# Patient Record
Sex: Female | Born: 1940 | Race: White | Hispanic: No | Marital: Married
Health system: Southern US, Community
[De-identification: ages and names within clinical notes are randomized; demographics above are authoritative.]

---

## 2004-04-29 ENCOUNTER — Emergency Department: Payer: Self-pay | Admitting: Emergency Medicine

## 2005-05-15 ENCOUNTER — Emergency Department: Payer: Self-pay | Admitting: Unknown Physician Specialty

## 2006-01-14 ENCOUNTER — Emergency Department: Payer: Self-pay | Admitting: Emergency Medicine

## 2007-01-26 ENCOUNTER — Ambulatory Visit: Payer: Self-pay | Admitting: Surgery

## 2007-01-26 ENCOUNTER — Other Ambulatory Visit: Payer: Self-pay

## 2007-02-02 ENCOUNTER — Ambulatory Visit: Payer: Self-pay | Admitting: Surgery

## 2007-02-08 ENCOUNTER — Emergency Department: Payer: Self-pay | Admitting: Emergency Medicine

## 2007-05-08 ENCOUNTER — Emergency Department: Payer: Self-pay | Admitting: Emergency Medicine

## 2007-10-24 ENCOUNTER — Emergency Department: Payer: Self-pay | Admitting: Emergency Medicine

## 2007-10-31 ENCOUNTER — Emergency Department: Payer: Self-pay | Admitting: Emergency Medicine

## 2008-04-28 ENCOUNTER — Inpatient Hospital Stay: Payer: Self-pay | Admitting: Internal Medicine

## 2009-04-23 ENCOUNTER — Emergency Department: Payer: Self-pay | Admitting: Emergency Medicine

## 2009-08-10 ENCOUNTER — Emergency Department: Payer: Self-pay | Admitting: Emergency Medicine

## 2009-08-12 ENCOUNTER — Emergency Department: Payer: Self-pay | Admitting: Emergency Medicine

## 2011-03-10 ENCOUNTER — Ambulatory Visit: Payer: Self-pay | Admitting: Urology

## 2011-06-01 ENCOUNTER — Emergency Department: Payer: Self-pay | Admitting: Emergency Medicine

## 2011-06-01 LAB — COMPREHENSIVE METABOLIC PANEL
Albumin: 3.7 g/dL (ref 3.4–5.0)
Alkaline Phosphatase: 42 U/L — ABNORMAL LOW (ref 50–136)
Anion Gap: 5 — ABNORMAL LOW (ref 7–16)
BUN: 19 mg/dL — ABNORMAL HIGH (ref 7–18)
Calcium, Total: 9.2 mg/dL (ref 8.5–10.1)
Creatinine: 0.96 mg/dL (ref 0.60–1.30)
Glucose: 94 mg/dL (ref 65–99)
SGOT(AST): 35 U/L (ref 15–37)

## 2011-06-01 LAB — CK TOTAL AND CKMB (NOT AT ARMC): CK, Total: 99 U/L (ref 21–215)

## 2011-06-01 LAB — CBC WITH DIFFERENTIAL/PLATELET
Basophil %: 0.3 %
Eosinophil %: 1.2 %
HCT: 39.3 % (ref 35.0–47.0)
HGB: 13.3 g/dL (ref 12.0–16.0)
Lymphocyte #: 1.9 10*3/uL (ref 1.0–3.6)
MCH: 31 pg (ref 26.0–34.0)
MCHC: 33.8 g/dL (ref 32.0–36.0)
MCV: 92 fL (ref 80–100)
Monocyte #: 0.6 10*3/uL (ref 0.0–0.7)
Neutrophil #: 4.9 10*3/uL (ref 1.4–6.5)
Platelet: 185 10*3/uL (ref 150–440)
RBC: 4.29 10*6/uL (ref 3.80–5.20)

## 2011-06-01 LAB — URINALYSIS, COMPLETE
Bilirubin,UR: NEGATIVE
Blood: NEGATIVE
Glucose,UR: NEGATIVE mg/dL (ref 0–75)
Nitrite: POSITIVE
Protein: NEGATIVE
Specific Gravity: 1.009 (ref 1.003–1.030)
Squamous Epithelial: NONE SEEN
WBC UR: 32 /HPF (ref 0–5)

## 2011-06-01 LAB — TROPONIN I: Troponin-I: <0.02 ng/mL

## 2011-06-03 LAB — URINE CULTURE

## 2011-06-30 ENCOUNTER — Emergency Department: Payer: Self-pay | Admitting: Emergency Medicine

## 2011-09-02 ENCOUNTER — Emergency Department: Payer: Self-pay | Admitting: Emergency Medicine

## 2011-09-02 LAB — URINALYSIS, COMPLETE
Bilirubin,UR: NEGATIVE
Glucose,UR: NEGATIVE mg/dL (ref 0–75)
Ketone: NEGATIVE
Nitrite: POSITIVE
Ph: 7 (ref 4.5–8.0)
Protein: 30
Specific Gravity: 1.008 (ref 1.003–1.030)
WBC UR: 86 /HPF (ref 0–5)

## 2011-09-02 LAB — BASIC METABOLIC PANEL
Calcium, Total: 9.6 mg/dL (ref 8.5–10.1)
Chloride: 97 mmol/L — ABNORMAL LOW (ref 98–107)
Creatinine: 1.43 mg/dL — ABNORMAL HIGH (ref 0.60–1.30)
EGFR (Non-African Amer.): 36 — ABNORMAL LOW
Glucose: 110 mg/dL — ABNORMAL HIGH (ref 65–99)
Osmolality: 278 (ref 275–301)
Potassium: 2.8 mmol/L — ABNORMAL LOW (ref 3.5–5.1)
Sodium: 136 mmol/L (ref 136–145)

## 2011-09-02 LAB — CBC
HCT: 34.1 % — ABNORMAL LOW (ref 35.0–47.0)
RDW: 13.3 % (ref 11.5–14.5)
WBC: 12.8 10*3/uL — ABNORMAL HIGH (ref 3.6–11.0)

## 2011-09-06 LAB — URINE CULTURE

## 2011-09-08 ENCOUNTER — Emergency Department: Payer: Self-pay | Admitting: Internal Medicine

## 2011-09-08 LAB — CULTURE, BLOOD (SINGLE)

## 2011-10-02 ENCOUNTER — Observation Stay: Payer: Self-pay | Admitting: Internal Medicine

## 2011-10-02 LAB — URINALYSIS, COMPLETE
Bilirubin,UR: NEGATIVE
Nitrite: NEGATIVE
Ph: 7 (ref 4.5–8.0)
Protein: NEGATIVE
RBC,UR: 2 /HPF (ref 0–5)
Specific Gravity: 1.005 (ref 1.003–1.030)
Squamous Epithelial: <1
WBC UR: 1 /HPF (ref 0–5)

## 2011-10-02 LAB — COMPREHENSIVE METABOLIC PANEL
Albumin: 3.5 g/dL (ref 3.4–5.0)
Anion Gap: 5 — ABNORMAL LOW (ref 7–16)
BUN: 19 mg/dL — ABNORMAL HIGH (ref 7–18)
Bilirubin,Total: 0.6 mg/dL (ref 0.2–1.0)
Calcium, Total: 9.8 mg/dL (ref 8.5–10.1)
Chloride: 101 mmol/L (ref 98–107)
Creatinine: 0.99 mg/dL (ref 0.60–1.30)
Glucose: 87 mg/dL (ref 65–99)
Osmolality: 277 (ref 275–301)
Potassium: 3.4 mmol/L — ABNORMAL LOW (ref 3.5–5.1)
Sodium: 138 mmol/L (ref 136–145)
Total Protein: 7.1 g/dL (ref 6.4–8.2)

## 2011-10-02 LAB — CBC
HGB: 12.3 g/dL (ref 12.0–16.0)
MCHC: 33 g/dL (ref 32.0–36.0)
MCV: 92 fL (ref 80–100)
RBC: 4.07 10*6/uL (ref 3.80–5.20)
RDW: 13.4 % (ref 11.5–14.5)
WBC: 5.8 10*3/uL (ref 3.6–11.0)

## 2011-10-02 LAB — CK TOTAL AND CKMB (NOT AT ARMC): CK-MB: 5.1 ng/mL — ABNORMAL HIGH (ref 0.5–3.6)

## 2011-10-03 LAB — CBC WITH DIFFERENTIAL/PLATELET
Basophil #: 0 10*3/uL (ref 0.0–0.1)
Eosinophil #: 0.1 10*3/uL (ref 0.0–0.7)
HCT: 37.6 % (ref 35.0–47.0)
HGB: 12.7 g/dL (ref 12.0–16.0)
Lymphocyte %: 30.9 %
MCHC: 33.8 g/dL (ref 32.0–36.0)
Monocyte #: 0.5 x10 3/mm (ref 0.2–0.9)
Monocyte %: 10 %
Neutrophil %: 55.3 %
Platelet: 165 10*3/uL (ref 150–440)
RDW: 13.6 % (ref 11.5–14.5)

## 2011-10-03 LAB — BASIC METABOLIC PANEL
Calcium, Total: 9.3 mg/dL (ref 8.5–10.1)
Creatinine: 0.8 mg/dL (ref 0.60–1.30)
EGFR (African American): >60
EGFR (Non-African Amer.): >60
Glucose: 91 mg/dL (ref 65–99)
Potassium: 3.6 mmol/L (ref 3.5–5.1)
Sodium: 142 mmol/L (ref 136–145)

## 2011-10-03 LAB — MAGNESIUM: Magnesium: 2 mg/dL

## 2011-10-04 LAB — URINE CULTURE

## 2011-10-17 ENCOUNTER — Emergency Department: Payer: Self-pay | Admitting: *Deleted

## 2011-11-17 ENCOUNTER — Emergency Department: Payer: Self-pay | Admitting: Emergency Medicine

## 2012-01-02 ENCOUNTER — Emergency Department: Payer: Self-pay | Admitting: Emergency Medicine

## 2012-01-02 LAB — URINALYSIS, COMPLETE
Blood: NEGATIVE
Glucose,UR: NEGATIVE mg/dL (ref 0–75)
Ketone: NEGATIVE
Nitrite: NEGATIVE
Ph: 7 (ref 4.5–8.0)
Protein: NEGATIVE
RBC,UR: 1 /HPF (ref 0–5)
Specific Gravity: 1.01 (ref 1.003–1.030)
WBC UR: 4 /HPF (ref 0–5)

## 2012-01-02 LAB — COMPREHENSIVE METABOLIC PANEL
Albumin: 3.8 g/dL (ref 3.4–5.0)
Anion Gap: 9 (ref 7–16)
Bilirubin,Total: 0.7 mg/dL (ref 0.2–1.0)
Chloride: 102 mmol/L (ref 98–107)
Co2: 32 mmol/L (ref 21–32)
Creatinine: 1.15 mg/dL (ref 0.60–1.30)
EGFR (African American): 55 — ABNORMAL LOW
EGFR (Non-African Amer.): 47 — ABNORMAL LOW
Osmolality: 287 (ref 275–301)
Potassium: 4 mmol/L (ref 3.5–5.1)
SGOT(AST): 36 U/L (ref 15–37)
SGPT (ALT): 25 U/L (ref 12–78)
Total Protein: 7.2 g/dL (ref 6.4–8.2)

## 2012-01-02 LAB — ETHANOL
Ethanol %: <0.003 % (ref 0.000–0.080)
Ethanol: <3 mg/dL

## 2012-01-02 LAB — DRUG SCREEN, URINE
Benzodiazepine, Ur Scrn: NEGATIVE (ref ?–200)
Cannabinoid 50 Ng, Ur ~~LOC~~: NEGATIVE (ref ?–50)
MDMA (Ecstasy)Ur Screen: NEGATIVE (ref ?–500)
Phencyclidine (PCP) Ur S: NEGATIVE (ref ?–25)
Tricyclic, Ur Screen: POSITIVE (ref ?–1000)

## 2012-01-02 LAB — CBC
HCT: 39 % (ref 35.0–47.0)
HGB: 13.3 g/dL (ref 12.0–16.0)
MCH: 30.9 pg (ref 26.0–34.0)
MCV: 91 fL (ref 80–100)
RBC: 4.29 10*6/uL (ref 3.80–5.20)
RDW: 13 % (ref 11.5–14.5)
WBC: 5.9 10*3/uL (ref 3.6–11.0)

## 2012-01-02 LAB — ACETAMINOPHEN LEVEL: Acetaminophen: <2 ug/mL

## 2012-01-02 LAB — TSH: Thyroid Stimulating Horm: 15.4 u[IU]/mL — ABNORMAL HIGH

## 2012-01-02 LAB — SALICYLATE LEVEL: Salicylates, Serum: <1.7 mg/dL

## 2012-01-02 LAB — TROPONIN I: Troponin-I: <0.02 ng/mL

## 2012-01-10 ENCOUNTER — Emergency Department: Payer: Self-pay | Admitting: Emergency Medicine

## 2012-02-02 ENCOUNTER — Emergency Department: Payer: Self-pay | Admitting: Emergency Medicine

## 2012-02-02 LAB — COMPREHENSIVE METABOLIC PANEL
Albumin: 3.5 g/dL (ref 3.4–5.0)
Alkaline Phosphatase: 64 U/L (ref 50–136)
BUN: 27 mg/dL — ABNORMAL HIGH (ref 7–18)
Bilirubin,Total: 0.4 mg/dL (ref 0.2–1.0)
Chloride: 102 mmol/L (ref 98–107)
Creatinine: 1.5 mg/dL — ABNORMAL HIGH (ref 0.60–1.30)
EGFR (African American): 40 — ABNORMAL LOW
EGFR (Non-African Amer.): 34 — ABNORMAL LOW
Glucose: 89 mg/dL (ref 65–99)
Potassium: 3.4 mmol/L — ABNORMAL LOW (ref 3.5–5.1)
SGOT(AST): 30 U/L (ref 15–37)
Total Protein: 6.7 g/dL (ref 6.4–8.2)

## 2012-02-02 LAB — CBC WITH DIFFERENTIAL/PLATELET
Eosinophil #: 0.2 10*3/uL (ref 0.0–0.7)
HCT: 36.1 % (ref 35.0–47.0)
Lymphocyte #: 2.6 10*3/uL (ref 1.0–3.6)
Lymphocyte %: 33.1 %
MCHC: 32.9 g/dL (ref 32.0–36.0)
MCV: 90 fL (ref 80–100)
Monocyte %: 11.8 %
Neutrophil %: 51.7 %
Platelet: 217 10*3/uL (ref 150–440)
RBC: 4.02 10*6/uL (ref 3.80–5.20)
RDW: 13.4 % (ref 11.5–14.5)
WBC: 7.7 10*3/uL (ref 3.6–11.0)

## 2012-02-02 LAB — URINALYSIS, COMPLETE
Blood: NEGATIVE
Glucose,UR: NEGATIVE mg/dL (ref 0–75)
Nitrite: NEGATIVE
Ph: 6 (ref 4.5–8.0)
Protein: 30
RBC,UR: 2 /HPF (ref 0–5)

## 2012-02-10 ENCOUNTER — Emergency Department: Payer: Self-pay | Admitting: Emergency Medicine

## 2012-02-10 LAB — URINALYSIS, COMPLETE
Bilirubin,UR: NEGATIVE
Glucose,UR: NEGATIVE mg/dL (ref 0–75)
Nitrite: NEGATIVE
Specific Gravity: 1.016 (ref 1.003–1.030)
WBC UR: 4 /HPF (ref 0–5)

## 2012-02-12 ENCOUNTER — Ambulatory Visit: Payer: Self-pay | Admitting: Internal Medicine

## 2012-02-21 LAB — CBC
HCT: 37.3 % (ref 35.0–47.0)
HGB: 12.7 g/dL (ref 12.0–16.0)
MCV: 91 fL (ref 80–100)
Platelet: 201 10*3/uL (ref 150–440)
RBC: 4.12 10*6/uL (ref 3.80–5.20)
WBC: 8.9 10*3/uL (ref 3.6–11.0)

## 2012-02-21 LAB — URINALYSIS, COMPLETE
Blood: NEGATIVE
Glucose,UR: NEGATIVE mg/dL (ref 0–75)
Ketone: NEGATIVE
Nitrite: NEGATIVE
Ph: 7 (ref 4.5–8.0)
Protein: NEGATIVE
RBC,UR: 35 /HPF (ref 0–5)
Squamous Epithelial: NONE SEEN
WBC UR: 1859 /HPF (ref 0–5)

## 2012-02-21 LAB — COMPREHENSIVE METABOLIC PANEL
Alkaline Phosphatase: 73 U/L (ref 50–136)
Anion Gap: 7 (ref 7–16)
Bilirubin,Total: 0.9 mg/dL (ref 0.2–1.0)
Calcium, Total: 9.8 mg/dL (ref 8.5–10.1)
Chloride: 102 mmol/L (ref 98–107)
Co2: 30 mmol/L (ref 21–32)
EGFR (African American): 46 — ABNORMAL LOW
EGFR (Non-African Amer.): 40 — ABNORMAL LOW
Osmolality: 283 (ref 275–301)
Potassium: 4.3 mmol/L (ref 3.5–5.1)

## 2012-02-22 LAB — BASIC METABOLIC PANEL
Anion Gap: 7 (ref 7–16)
BUN: 27 mg/dL — ABNORMAL HIGH (ref 7–18)
Calcium, Total: 9 mg/dL (ref 8.5–10.1)
Co2: 27 mmol/L (ref 21–32)
Glucose: 83 mg/dL (ref 65–99)
Osmolality: 282 (ref 275–301)
Potassium: 3.6 mmol/L (ref 3.5–5.1)
Sodium: 139 mmol/L (ref 136–145)

## 2012-02-22 LAB — CBC WITH DIFFERENTIAL/PLATELET
Eosinophil #: 0.1 10*3/uL (ref 0.0–0.7)
Eosinophil %: 1.7 %
HCT: 35.8 % (ref 35.0–47.0)
HGB: 12.6 g/dL (ref 12.0–16.0)
Lymphocyte #: 1.4 10*3/uL (ref 1.0–3.6)
MCV: 89 fL (ref 80–100)
Monocyte %: 8.3 %
Neutrophil #: 5.3 10*3/uL (ref 1.4–6.5)
Neutrophil %: 70.7 %
RBC: 4.01 10*6/uL (ref 3.80–5.20)
RDW: 13 % (ref 11.5–14.5)

## 2012-02-23 ENCOUNTER — Inpatient Hospital Stay: Payer: Self-pay | Admitting: Family Medicine

## 2012-02-23 LAB — URINE CULTURE

## 2012-02-23 LAB — BASIC METABOLIC PANEL
Anion Gap: 12 (ref 7–16)
Calcium, Total: 8.8 mg/dL (ref 8.5–10.1)
Chloride: 103 mmol/L (ref 98–107)
Co2: 26 mmol/L (ref 21–32)
Creatinine: 1.13 mg/dL (ref 0.60–1.30)
Glucose: 91 mg/dL (ref 65–99)
Osmolality: 285 (ref 275–301)
Potassium: 2.9 mmol/L — ABNORMAL LOW (ref 3.5–5.1)

## 2012-02-23 LAB — MAGNESIUM: Magnesium: 0.9 mg/dL — ABNORMAL LOW

## 2012-03-14 ENCOUNTER — Ambulatory Visit: Payer: Self-pay | Admitting: Internal Medicine

## 2012-03-14 DEATH — deceased

## 2013-03-24 IMAGING — CR DG CHEST 2V
1 series · 3 of 3 positions shown · non-contrast
Comparison: none

REASON FOR EXAM: altered emntal status
COMMENTS:

PROCEDURE:     DXR - DXR CHEST PA (OR AP) AND LATERAL  - October 02, 2011  [DATE]
RESULT:     Comparison is made with the exam of 02 September, 2011.
There is hyperinflation consistent with COPD. There is no underlying edema,
infiltrate, effusion or mass. The bony structures appear intact.

[Series 3: x chest ap · 0.14mm/px · 3 of 3 slices shown]
[im 1/3]
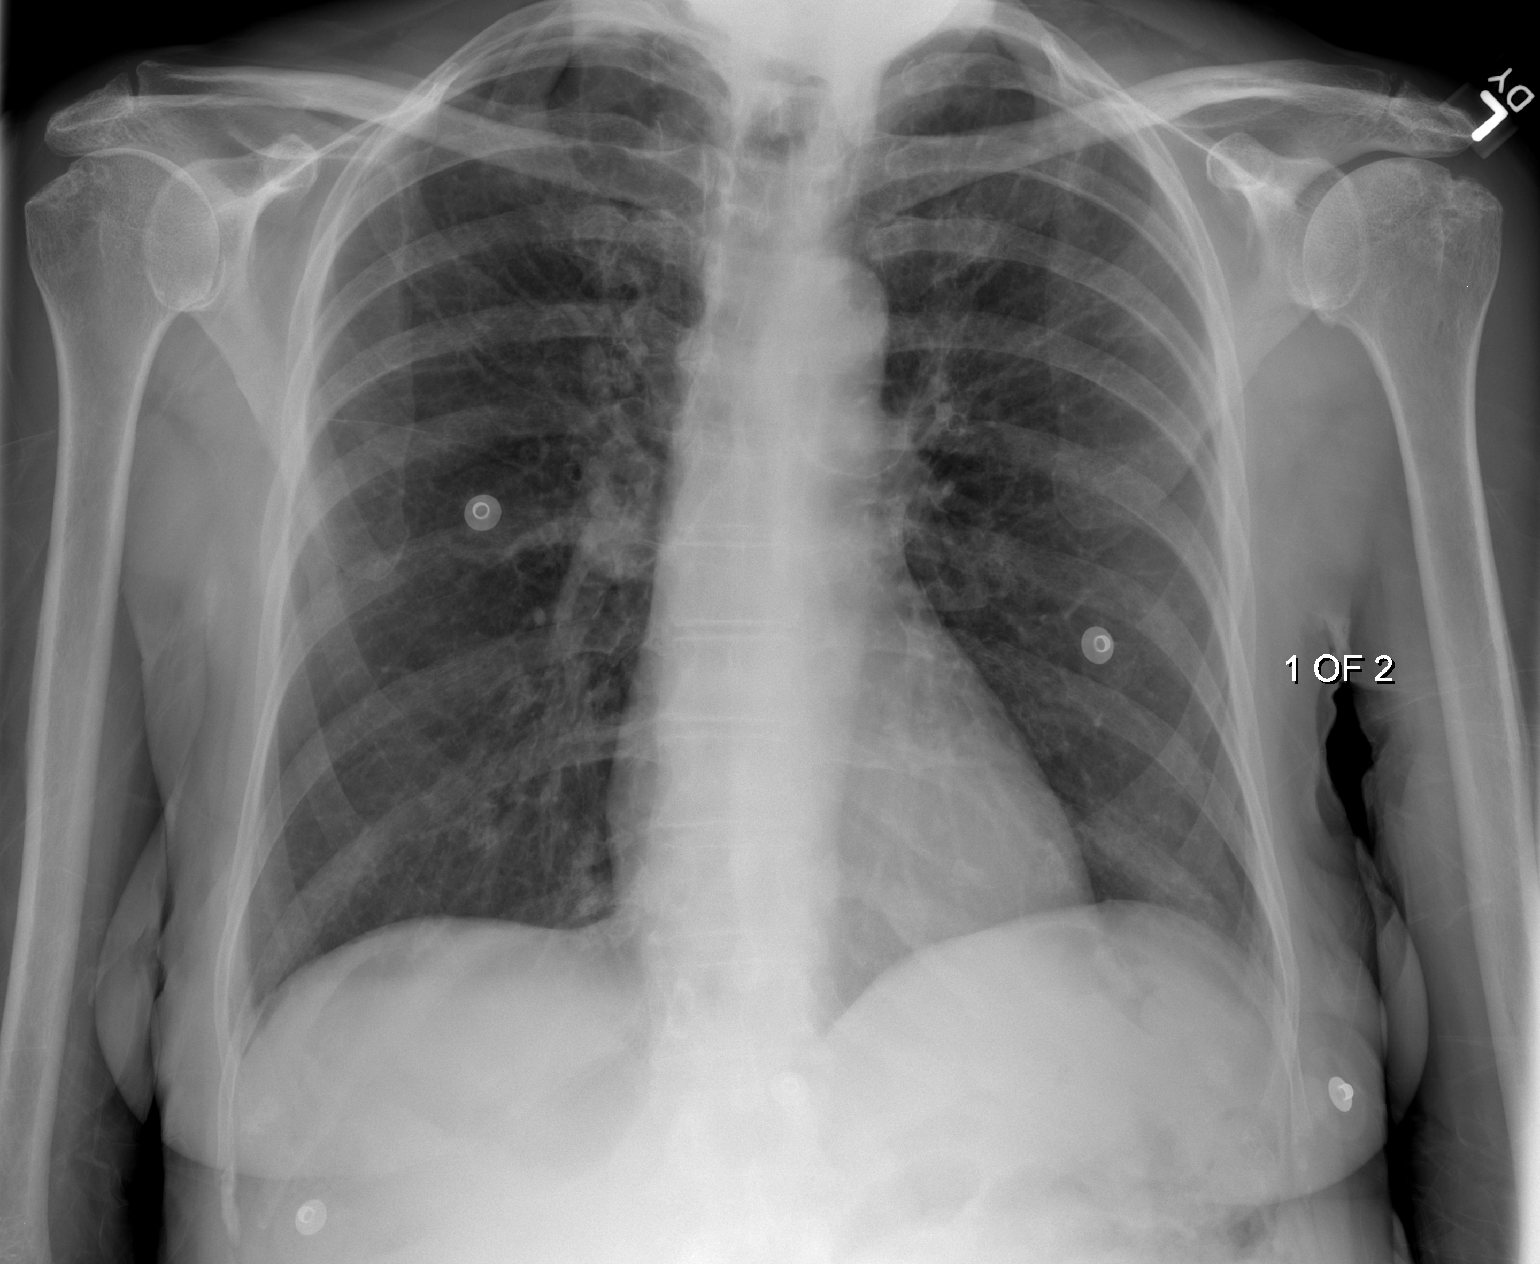
[im 2/3]
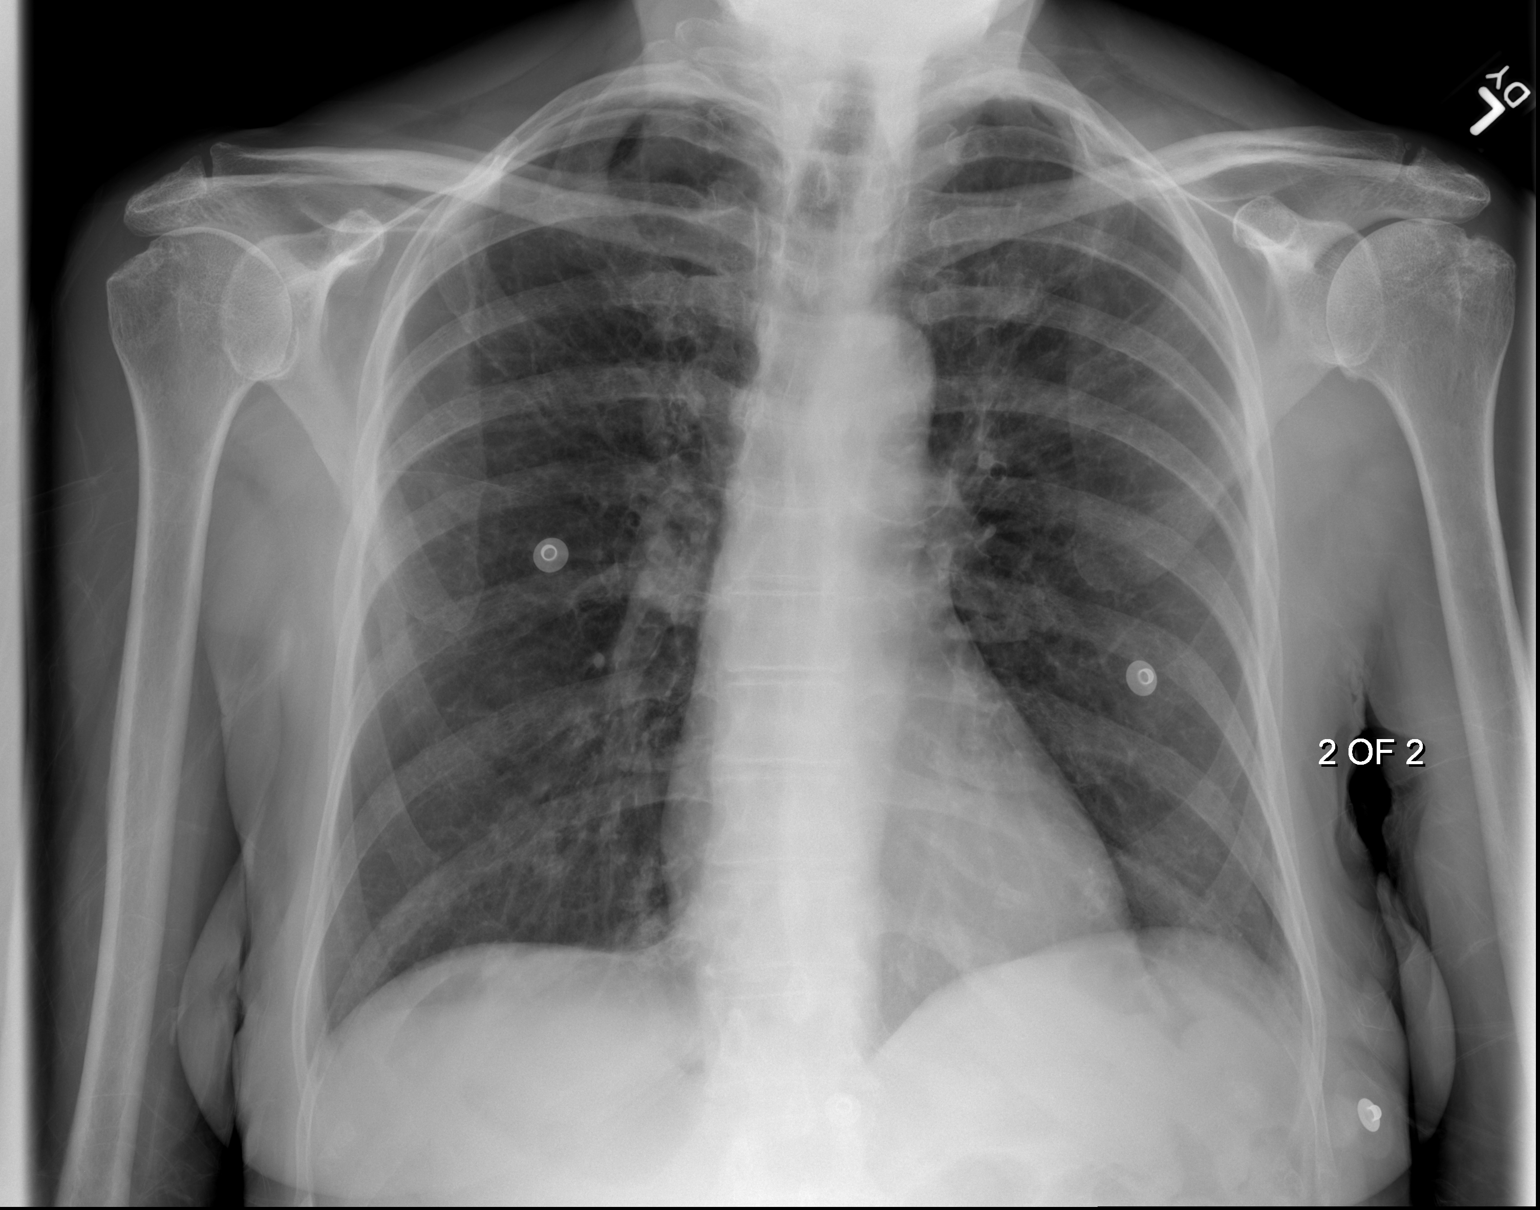
[im 3/3]
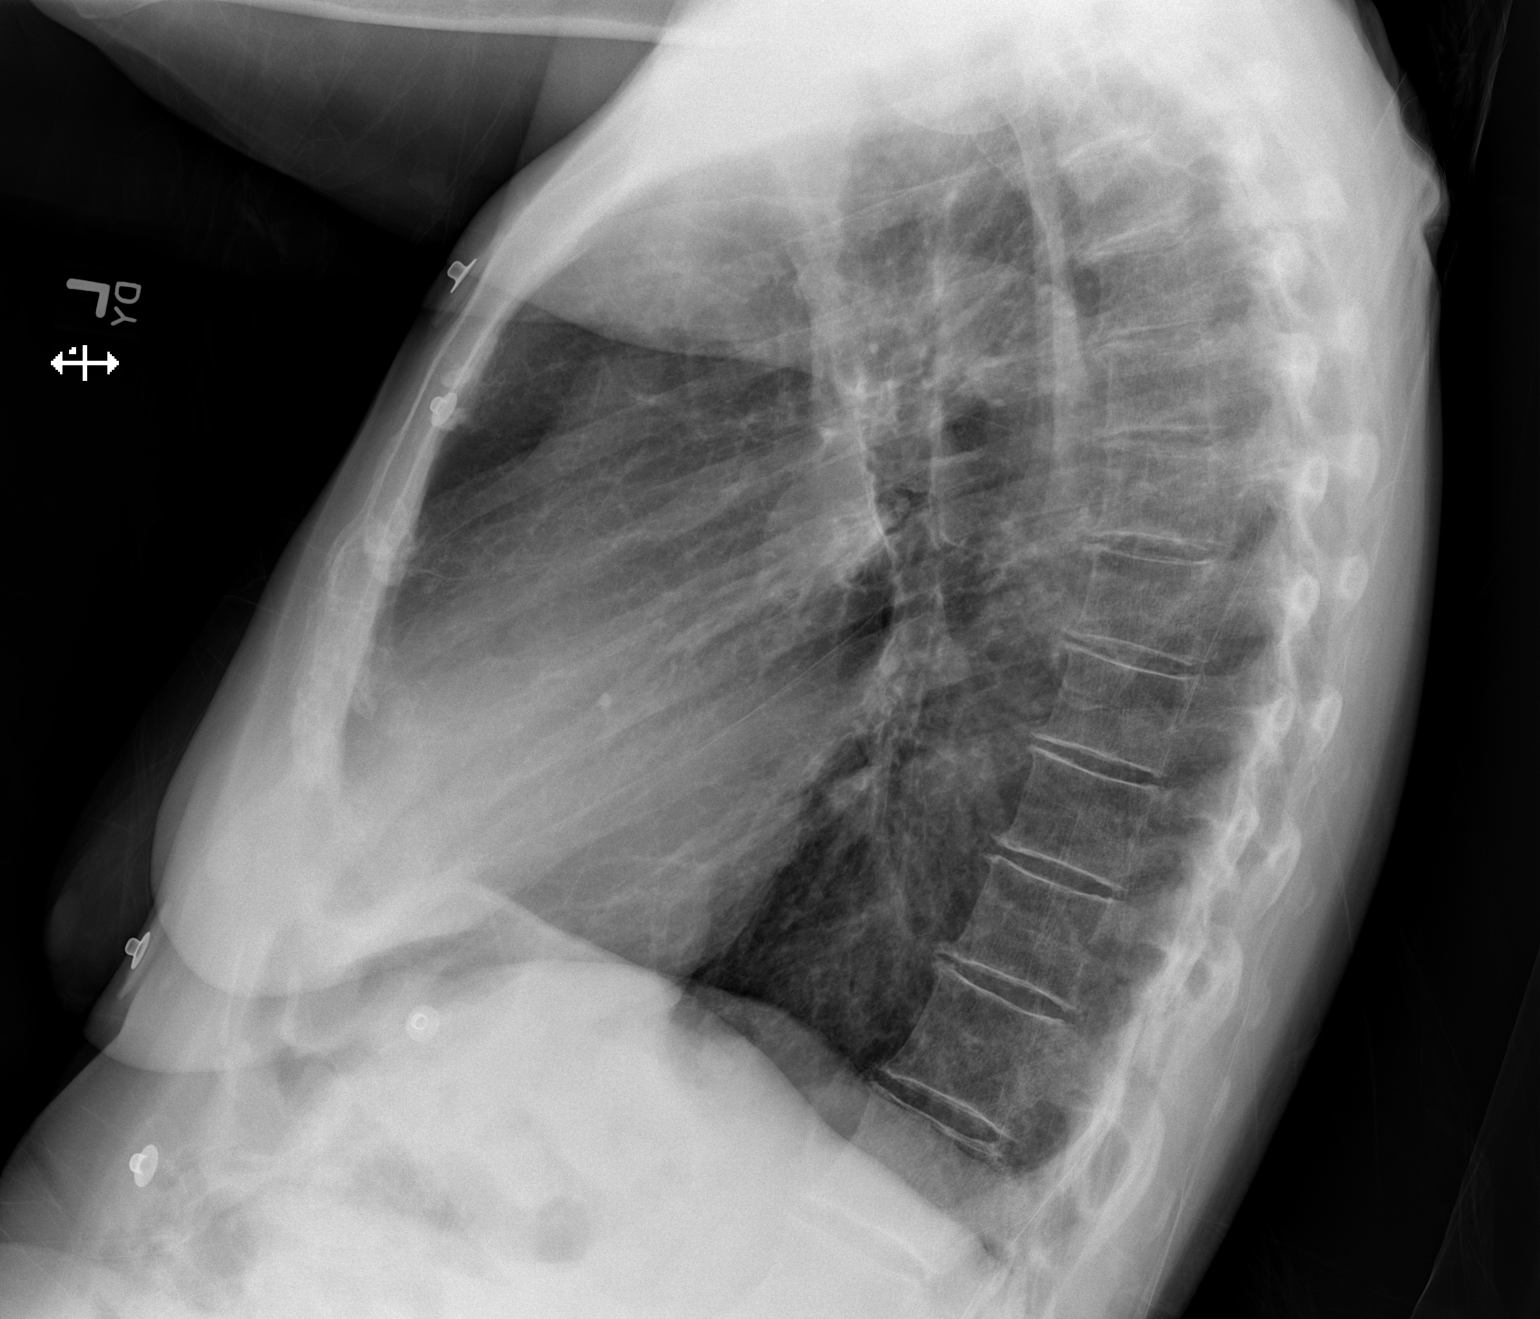

[3 of 3 positions shown; findings below may reference images not displayed]

IMPRESSION: 1. Hyperinflation consistent with COPD.
2. No acute cardiopulmonary disease or significant interval change.

[REDACTED]

## 2013-03-24 IMAGING — CT CT HEAD WITHOUT CONTRAST
2 series · 16 of 30 positions shown, 20 images · non-contrast
Comparison: none

REASON FOR EXAM: frequent falls with change in mental status
COMMENTS:

[Series 2: without · axial · non-contrast · 0.44mm/px · z∈[-204,-78]mm · 13 of 31 slices shown, 17 images]
[im 3/31  brain]
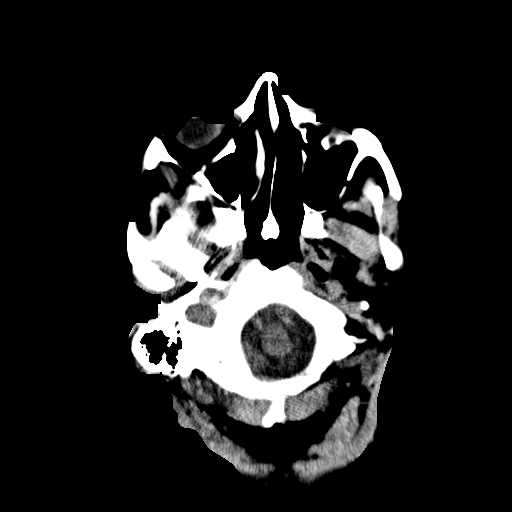
[im 3/31  bone]
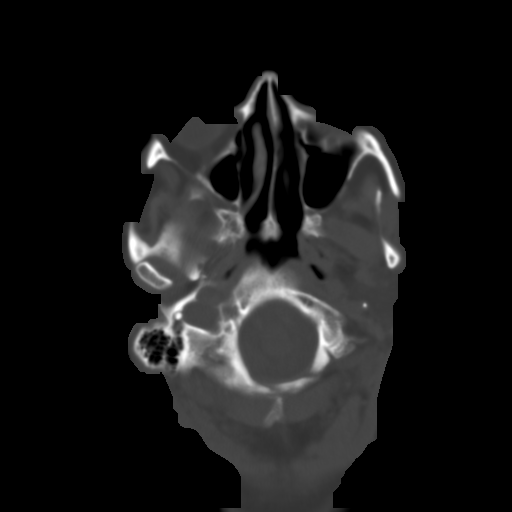
[im 5/31  brain]
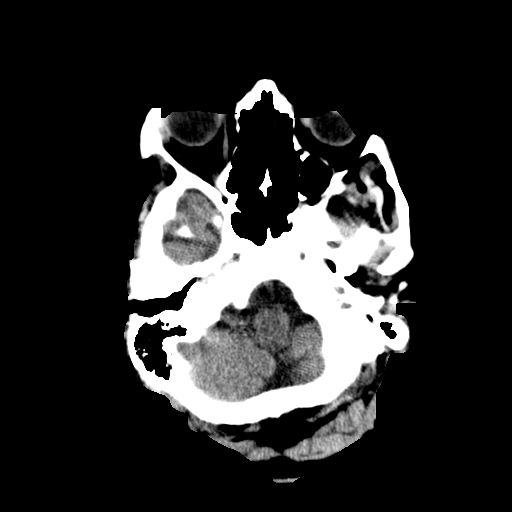
[im 7/31  brain]
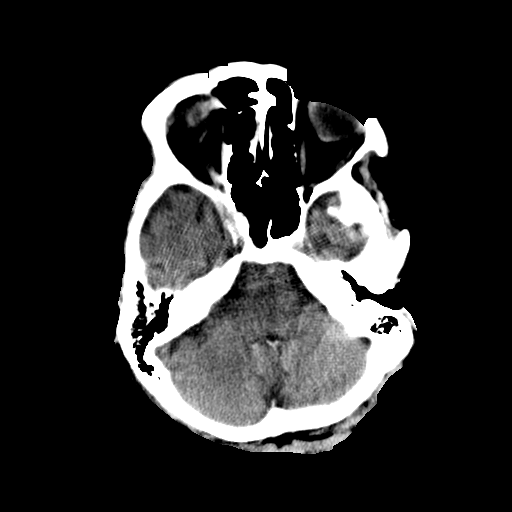
[im 9/31  brain]
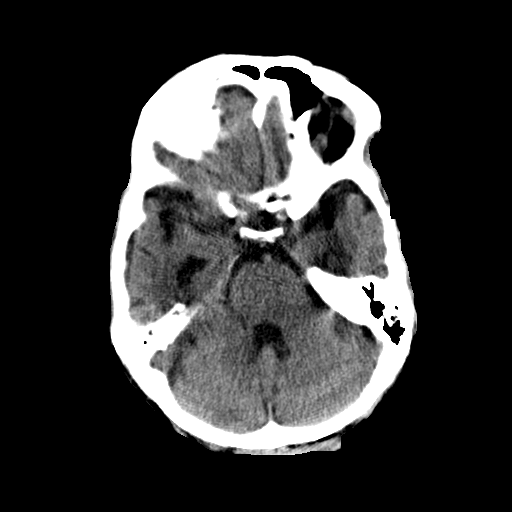
[im 11/31  brain]
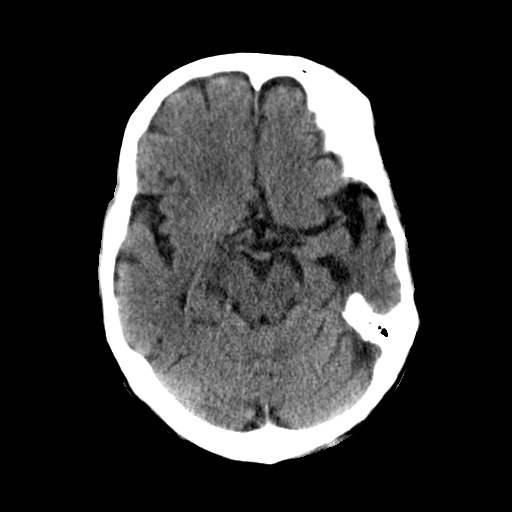
[im 11/31  bone]
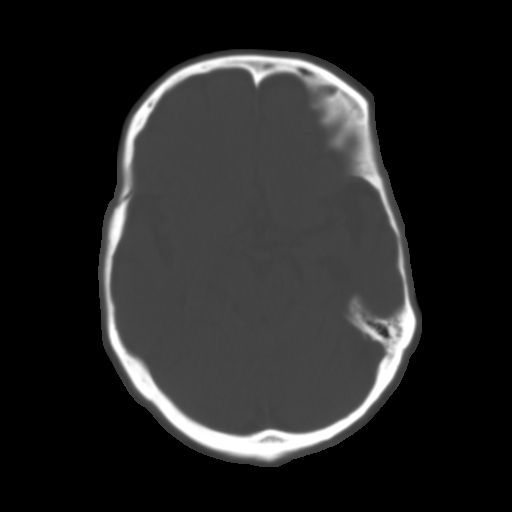
[im 13/31  brain]
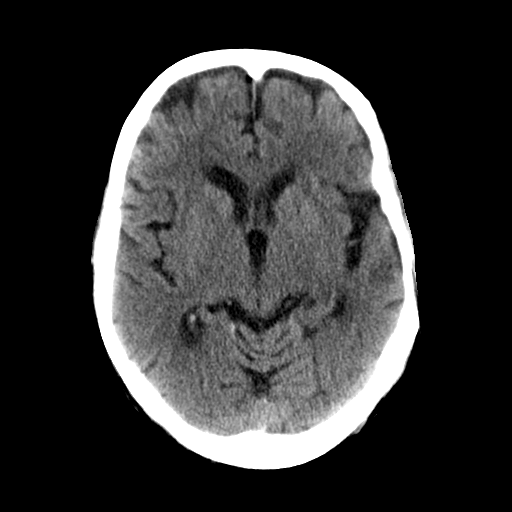
[im 16/31  brain]
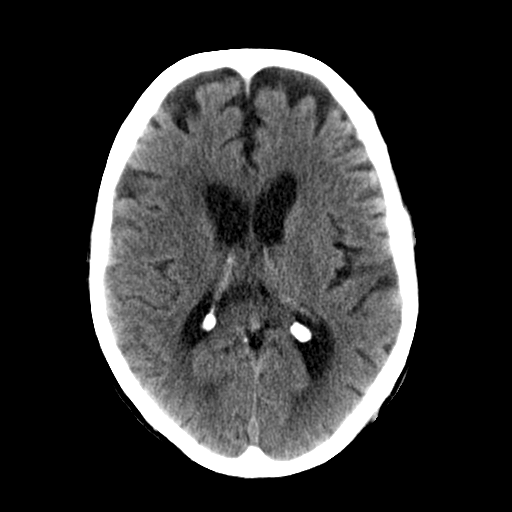
[im 18/31  brain]
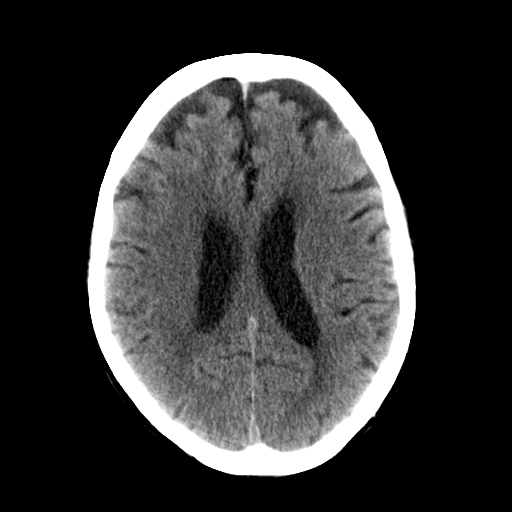
[im 20/31  brain]
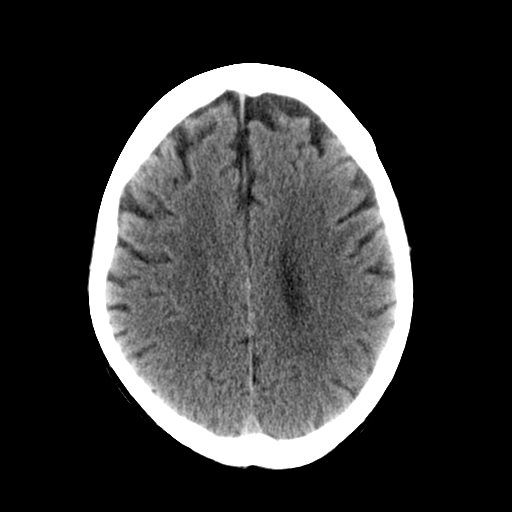
[im 20/31  bone]
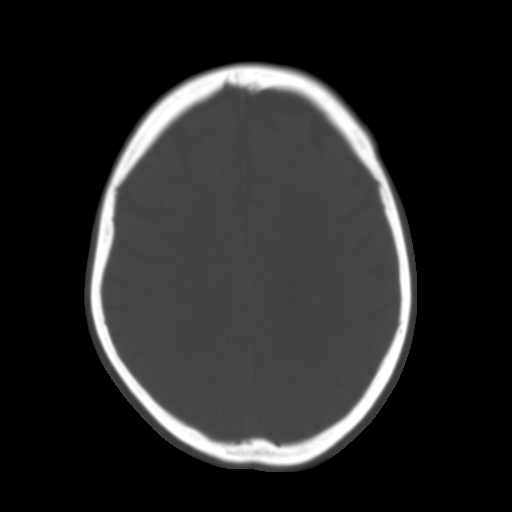
[im 22/31  brain]
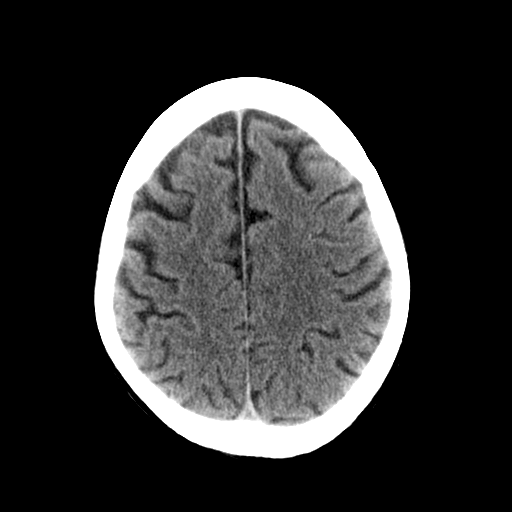
[im 24/31  brain]
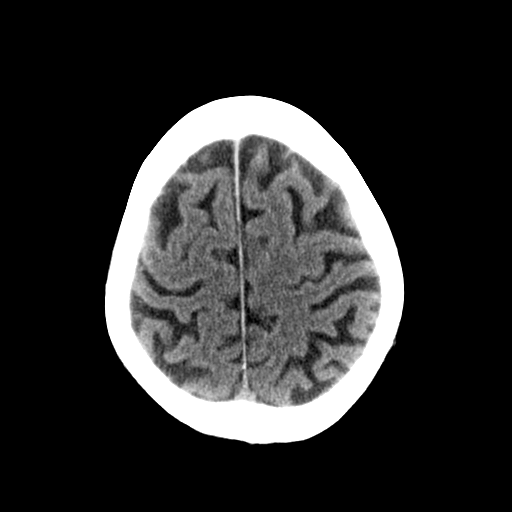
[im 26/31  brain]
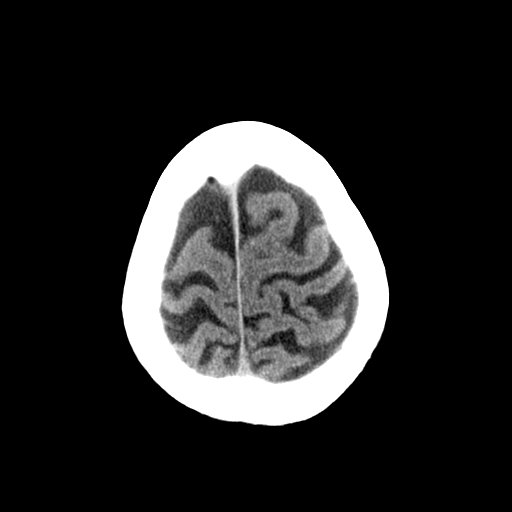
[im 28/31  brain]
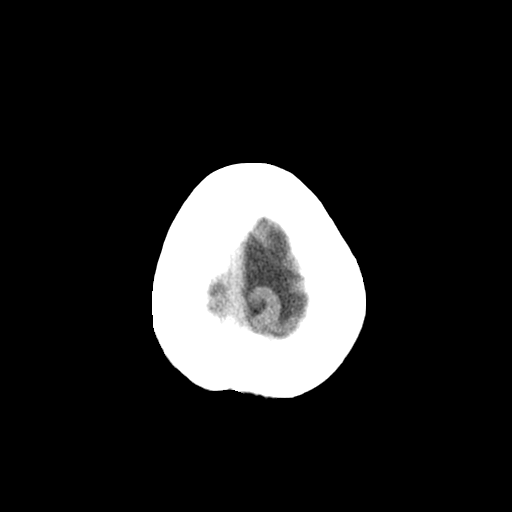
[im 28/31  bone]
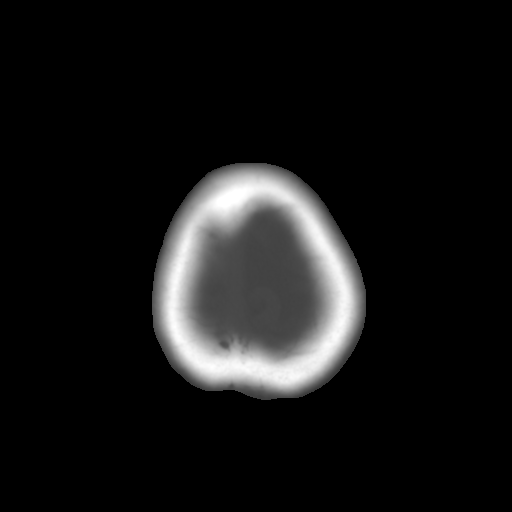

[Series 3: bone · axial · 0.44mm/px · z∈[-204,-164]mm · 3 of 31 slices shown]
[im 3/31  bone]
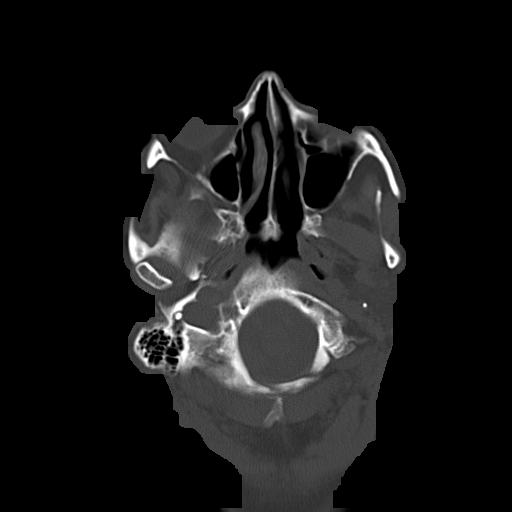
[im 7/31  bone]
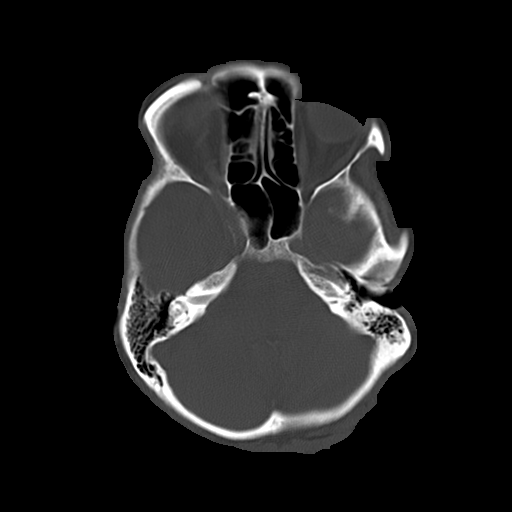
[im 11/31  bone]
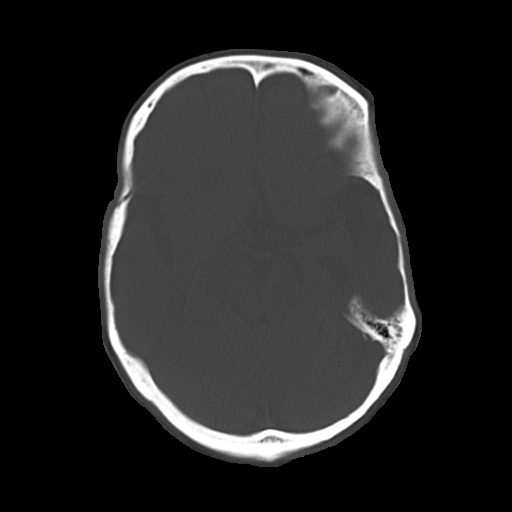

[16 of 30 positions shown; findings below may reference images not displayed]

PROCEDURE:     CT  - CT HEAD WITHOUT CONTRAST  - October 02, 2011  [DATE]

RESULT:     CT of the brain is compared to the most recent study dated 01 June, 2011.

There is atrophy with prominence of the ventricles and sulci similar to that
seen previously. There is no evidence of intracranial hemorrhage, mass or
mass effect. There is no midline shift. No territorial infarct is
demonstrated. The paranasal sinuses and mastoid air cells show normal
appearing aeration. The calvarium is intact. The orbits appear normal.
IMPRESSION: 1. Changes of atrophy. No acute intracranial abnormality or significant
change

[REDACTED]

## 2013-07-25 IMAGING — CR DG KNEE 1-2V*L*
1 series · 2 of 2 positions shown · non-contrast
Comparison: none

REASON FOR EXAM: fall
COMMENTS:

PROCEDURE:     DXR - DXR KNEE LEFT AP AND LATERAL  - February 02, 2012  [DATE]
RESULT:     Comparison: 11/17/2011

[Series 4: t knee ap left · 0.14mm/px · 2 of 2 slices shown]
[im 1/2]
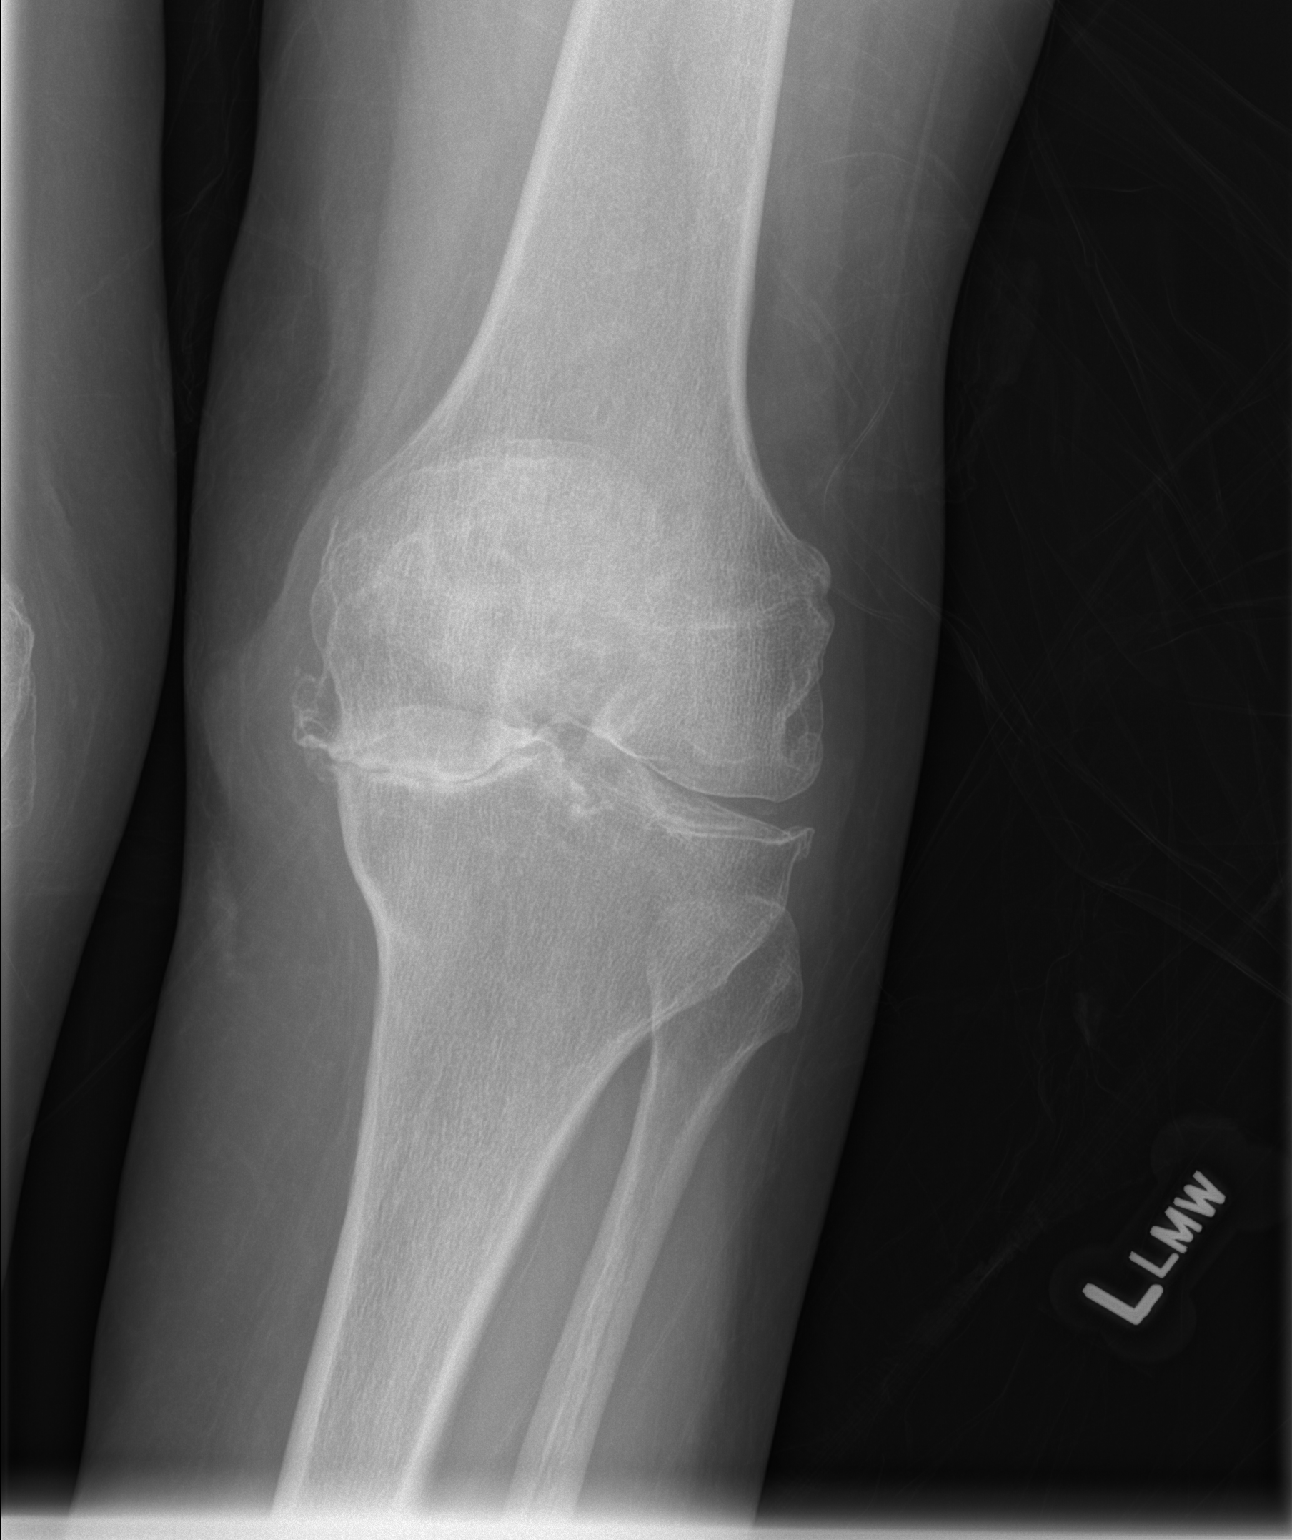
[im 2/2]
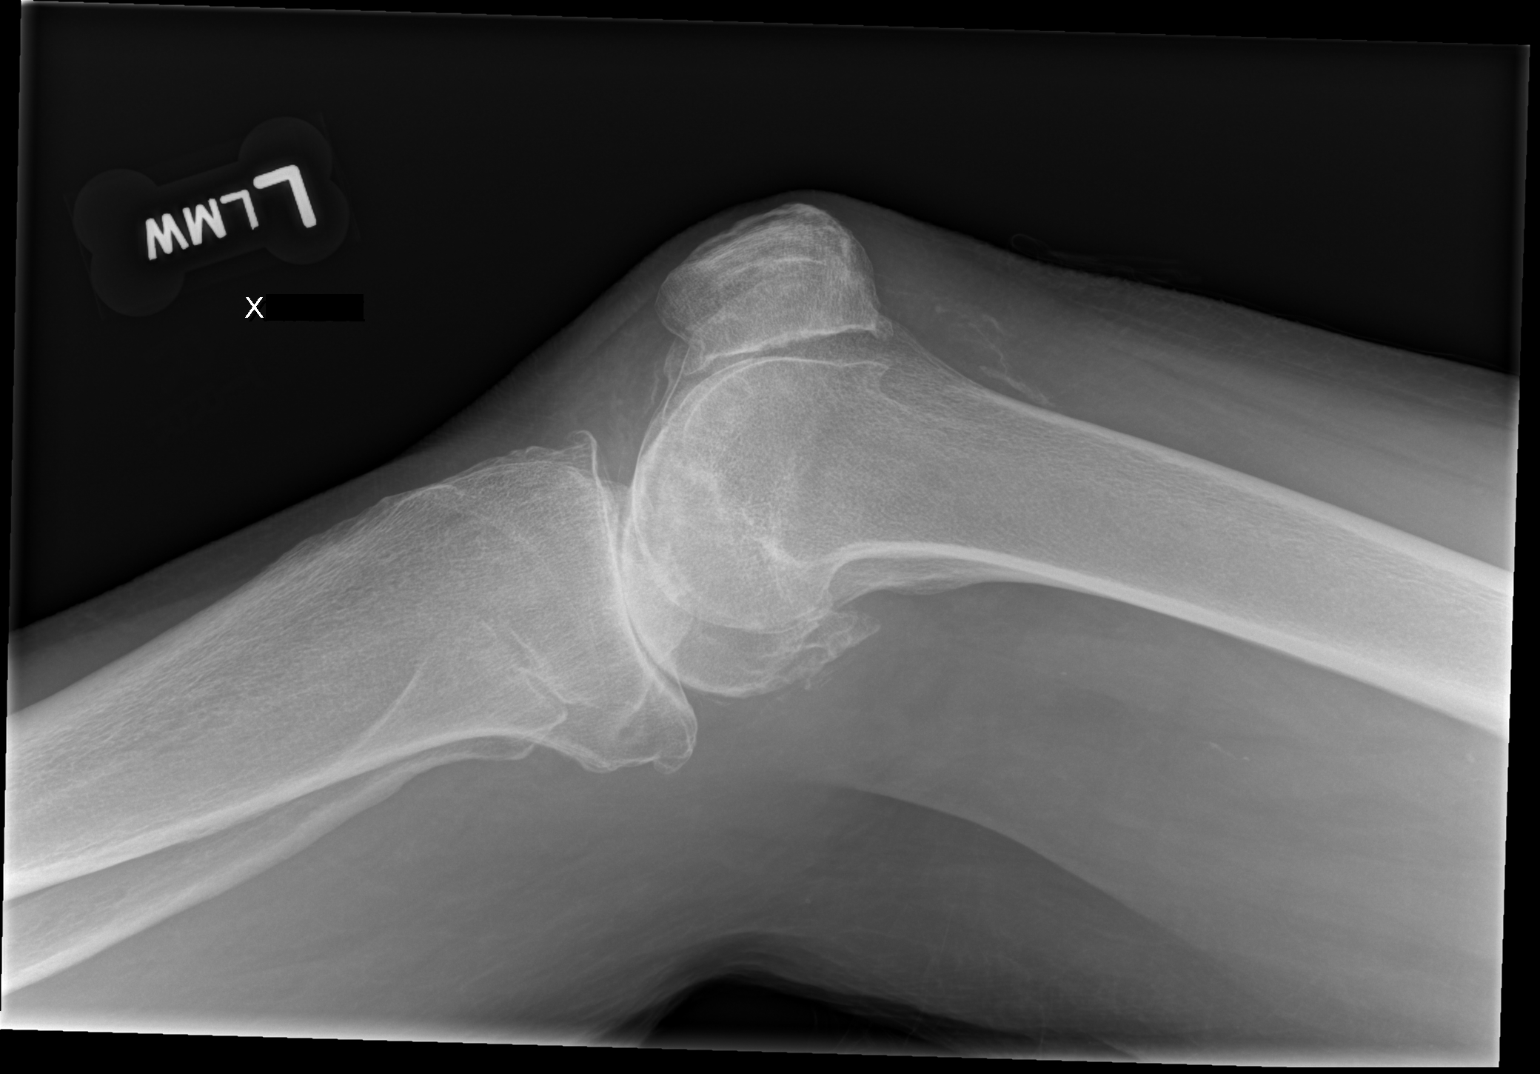

[2 of 2 positions shown; findings below may reference images not displayed]

FINDINGS: There is tricompartmental degenerative change, greatest in the medial
compartment. No significant effusion. No acute fracture seen.
IMPRESSION: No acute fracture.

[REDACTED]

## 2013-07-25 IMAGING — CR DG HIP COMPLETE 2+V*L*
1 series · 2 of 2 positions shown · non-contrast
Comparison: none

REASON FOR EXAM: fall
COMMENTS:

PROCEDURE:     DXR - DXR HIP LEFT COMPLETE  - February 02, 2012  [DATE]
RESULT:     Comparison: None.

[Series 4: t hip ap left · 0.14mm/px · 2 of 2 slices shown]
[im 1/2]
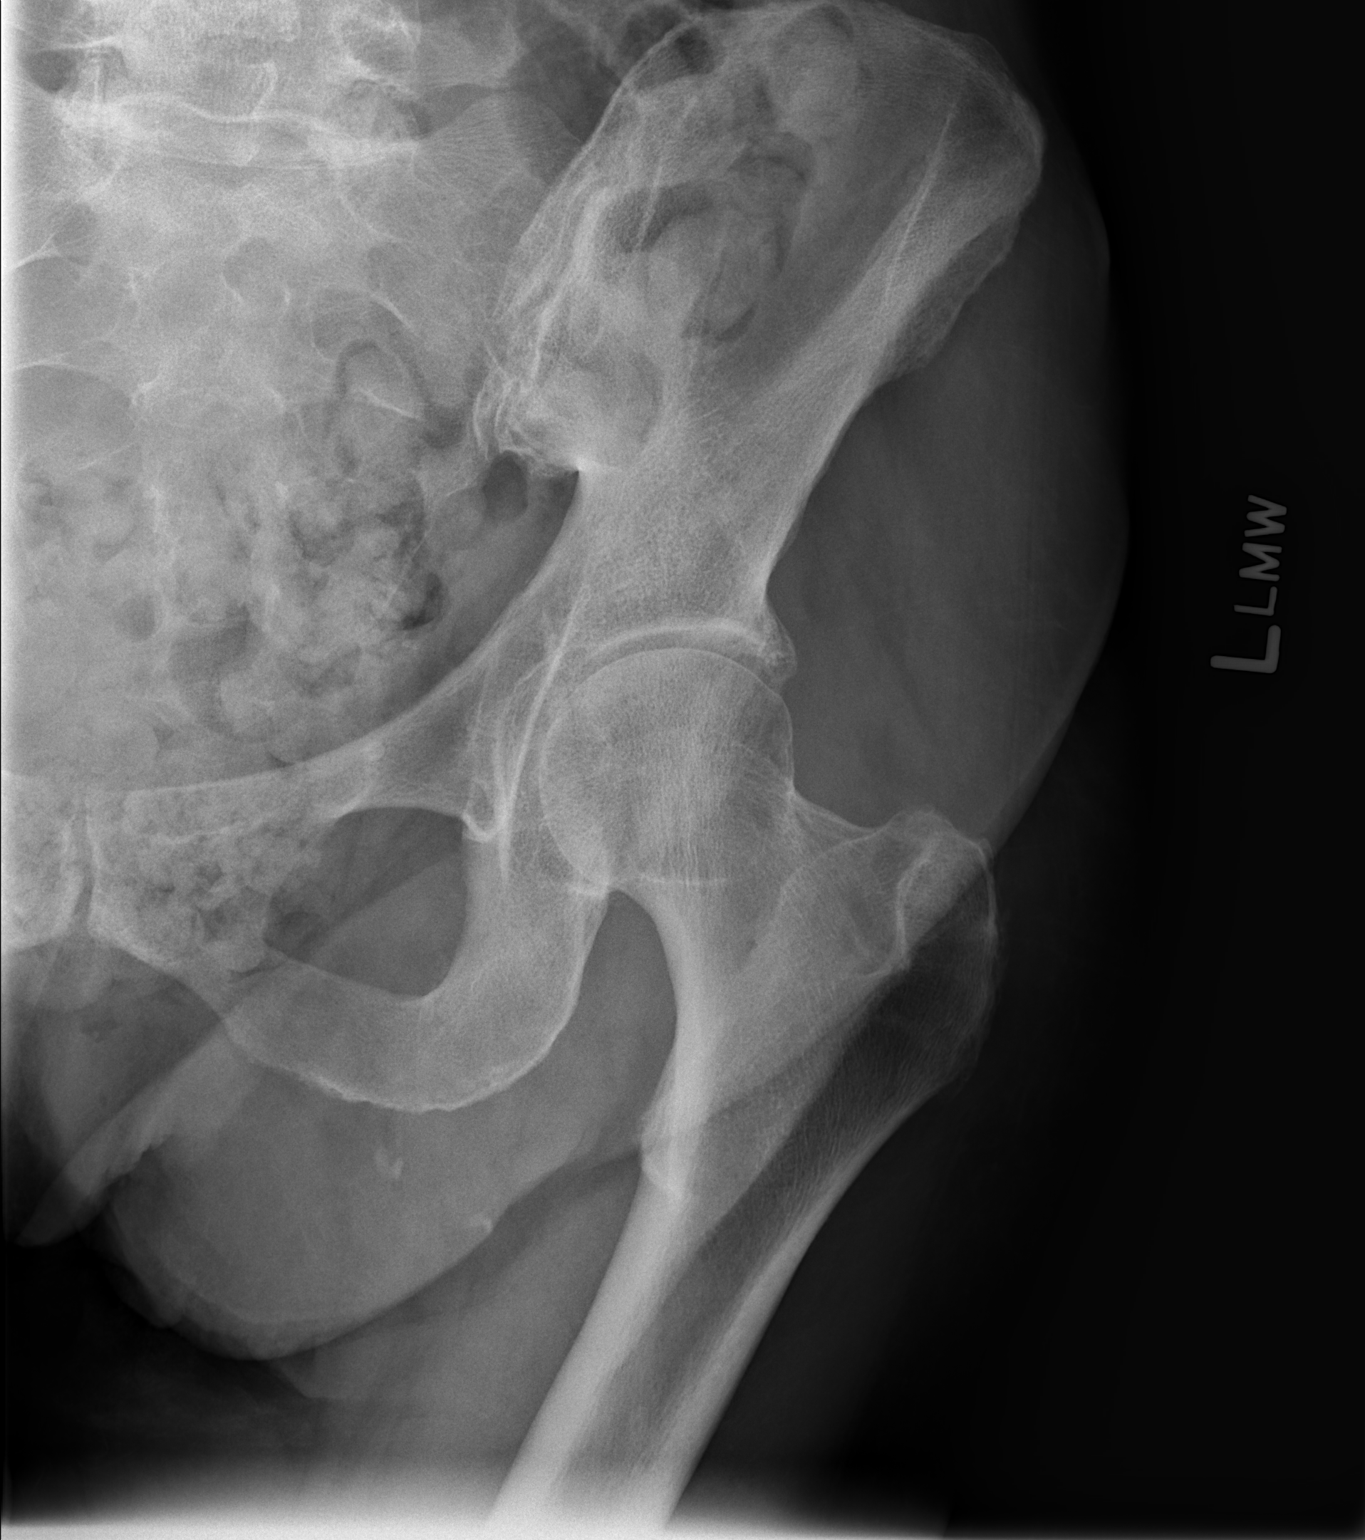
[im 2/2]
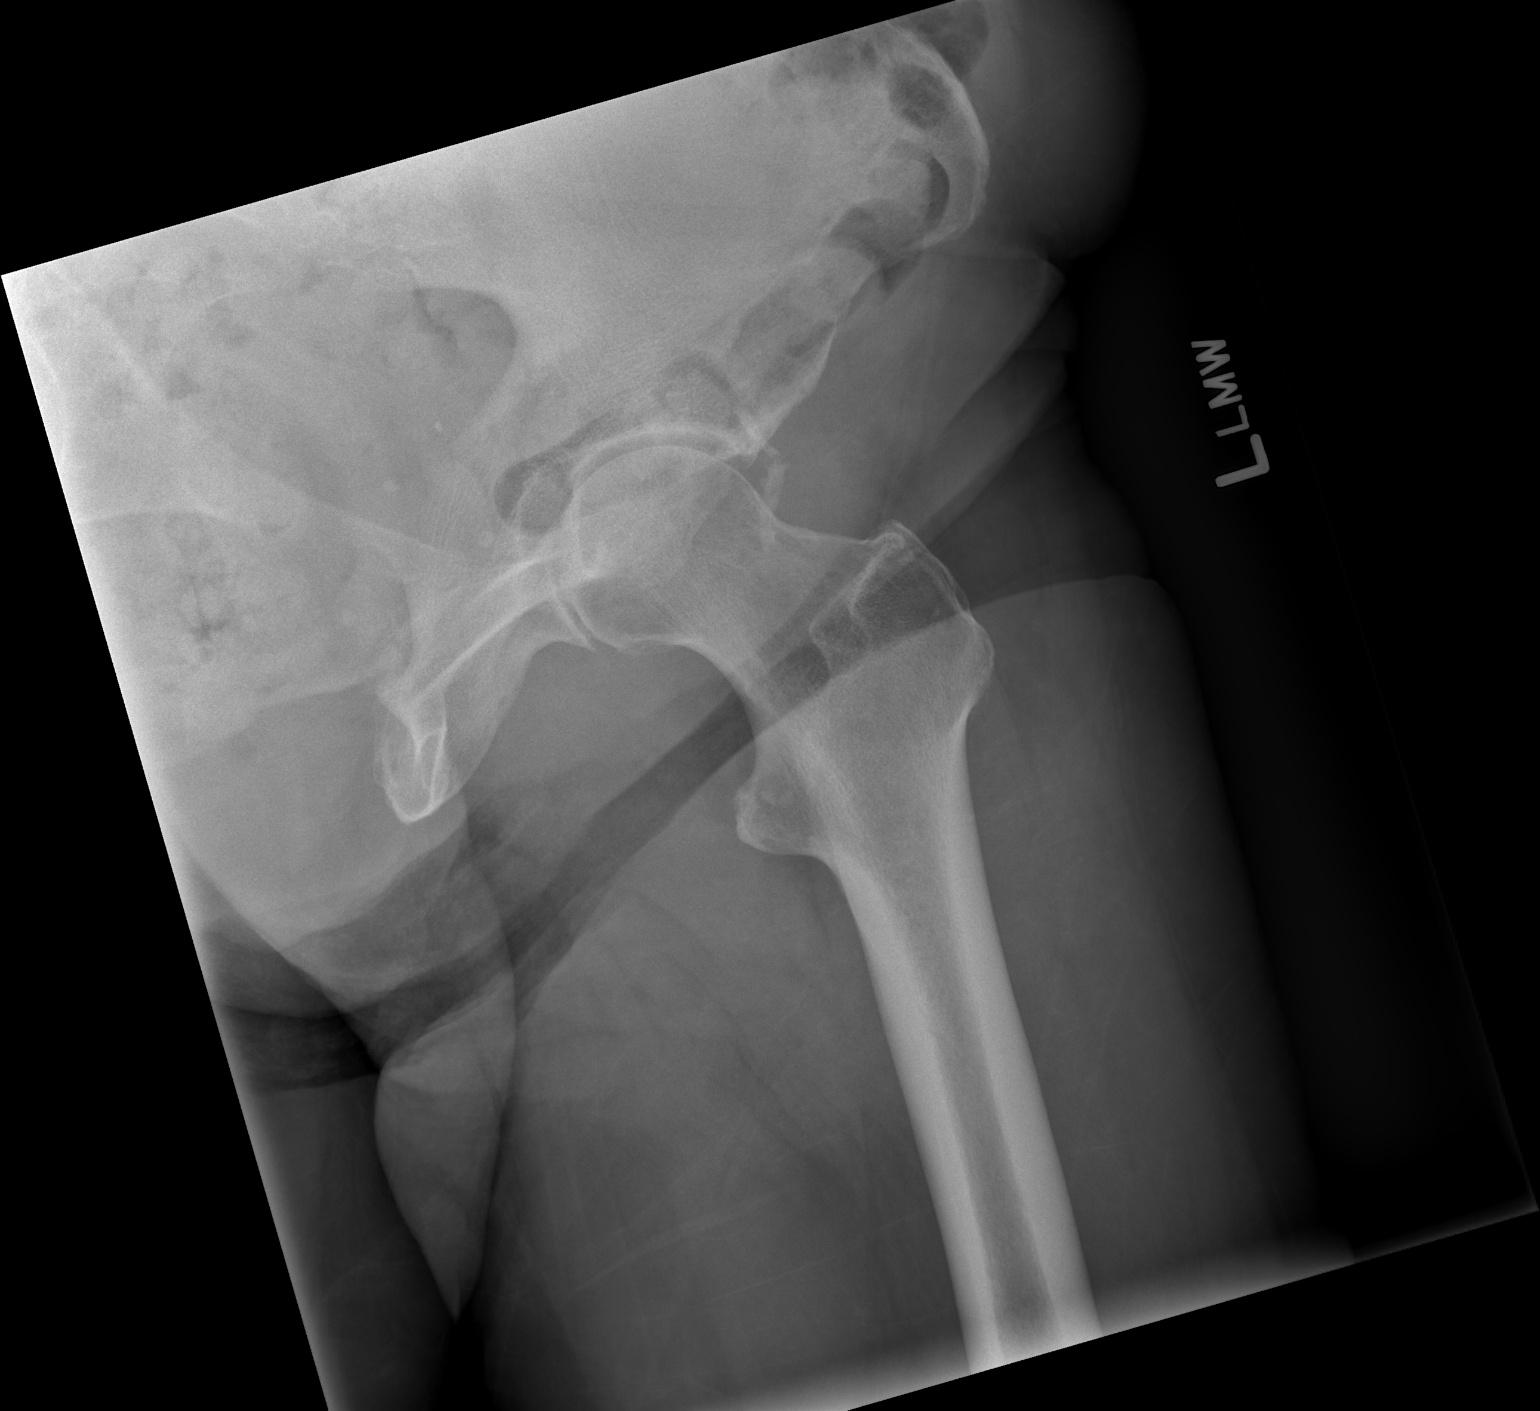

[2 of 2 positions shown; findings below may reference images not displayed]

FINDINGS: No hip fracture seen. Small well-corticated ossific densities lateral to the
acetabulum on the frog-leg lateral view are likely sequela of old prior
trauma.
IMPRESSION: No hip fracture seen. Given the patient's age and relative bone density, if
there is continued clinical concern for occult hip fracture, further
evaluation with MRI or CT would be recommended.

[REDACTED]

## 2014-07-01 NOTE — H&P (Signed)
PATIENT NAME:  Catherine Nguyen, Catherine Nguyen MR#:  [id number] DATE OF BIRTH:  06/20/1940  DATE OF ADMISSION:  02/21/2012  PRIMARY CARE PHYSICIAN: Dr. Yves DillNeelam Khan.   CHIEF COMPLAINT: Not ambulating, not participating at assisted living.  HISTORY OF PRESENT ILLNESS:  This is a 74 year old female with Alzheimer's dementia, moderate to severe, who [facility] with chief complaint of not ambulating, decreased mental status and not participating in activities. She has been here in the past with urinary tract infections. Her cultures have been Escherichia coli positive. She comes back with a recent urinary tract infection. CT of the pelvis was performed as the patient has not been ambulating for several weeks. The CT of the pelvis shows a large cystic mass within the lower abdomen and pelvis.   REVIEW OF SYSTEMS: Unable to obtain as the patient has dementia.   PAST MEDICAL HISTORY:  1. Alzheimer's dementia.  2. Hypertension.  3. Hyperlipidemia. 4. Myalgias.  5. Hypothyroidism.   MEDICATIONS:  1. Voltaren 1% gel bilaterally.  2. Tylenol arthritis 1 capsule every eight hours as needed.  3. Premarin vaginal cream Monday, Wednesday, and Friday at bedtime.  4. Lorazepam 1 mg p.o. q.12 hours p.Nguyen.n. agitation. 5. Zofran 4 mg every eight hours as needed.  6. Vitamin D 1.25 mg weekly.  7. Children's aspirin 81 mg daily.  8. Citalopram 20 mg daily.  9. Synthroid 88 mcg daily.  10. Omeprazole 20 mg daily.  11. Calcium chew, one chewable by mouth daily.  12. Triamterene/HCTZ 37.5/25 daily.  13. Ativan 1 mg b.i.d.  14. Amitriptyline 10 mg at bedtime.  15. Lunesta 1 mg by mouth at bedtime.  16. Pravastatin 40 mg at bedtime.  17. Hemorrhoidal ointment to affected area twice daily.  18. Docusate 100 mg twice a day p.Nguyen.n.   ALLERGIES: No known drug allergies.   PAST SURGICAL HISTORY: Per the patient's chart, not located.   SOCIAL HISTORY: The patient is a resident at assisted living.    FAMILY HISTORY: Per the chart, no family history of coronary artery disease.   PHYSICAL EXAMINATION:  VITAL SIGNS: Temperature 98.2, pulse 72, respirations 20, blood pressure 105/59.   GENERAL: The patient is a completely disoriented, pleasantly demented woman sitting in a chair, not in acute distress.   HEENT: Head is atraumatic. Pupils are round and reactive. Mucous membranes are dry. Oropharynx is clear.   NECK: Supple. No obvious enlarged thyroid.   CARDIOVASCULAR: Regular rate and rhythm. No murmurs, gallops, or rubs. PMI is not displaced.   ABDOMEN: Bowel sounds are positive. Nontender, nondistended. I do not appreciate any kind of mass.   LUNGS: Clear to auscultation without crackles, rales, rhonchi or wheezing.    EXTREMITIES: No clubbing, cyanosis, or edema.   NEUROLOGIC: Hard to do a neurologic examination as the patient does not follow directions very well.   SKIN: No rashes. She does have some bruising.   LABORATORY, RADIOLOGICAL AND DIAGNOSTIC DATA: Sodium 139, potassium 4.3, chloride 102, bicarbonate 30, BUN 31, creatinine 1.33, glucose 75, calcium 9.8, bilirubin 0.9, alkaline phosphatase 73, ALT 66, AST 92, total protein 7.5, albumin 3.7. White blood cells 8.9, hemoglobin 12.7, hematocrit 37.3, platelets 201. Urinalysis shows 3+ leukocyte esterase with 1,859 white blood cells. CT of the pelvis shows a large cystic mass within the lower abdomen and pelvis.   ASSESSMENT AND PLAN: The patient is a 74 year old female with Alzheimer's dementia who presented with recurrent urinary tract infections from assisted living facility.  1. Recurrent urinary  tract infections, likely secondary to her pelvic mass seen on CT. She has been positive for Escherichia coli sensitive to Rocephin in the past, so we will continue with this and follow up with urine cultures.  2. Pelvic mass. I have spoken with the patient's husband who is the power of attorney. At this time given her severe  Alzheimer's dementia, it would not be recommended to investigate for cancer with a biopsy and the patient would not be a good candidate for chemotherapy. I discussed this with the patient's husband who is in agreement for a more conservative approach. He is aware that due to the patient's pelvic mass, she may have more recurrent urinary tract infections. 3. Alzheimer's dementia. Will continue to monitor.  4. Acute renal failure. We will hold hypertensive medications, provide some IV fluids and monitor. Hypertension. As above, will hold medications and monitor blood pressure.  5. The patient appears to be a FULL CODE status. I will consult palliative care for code status clarification as well as discharge planning.   TIME SPENT: Approximately 40 minutes.   ____________________________ Janyth Contes. Juliene Pina, MD spm:ap D: 02/21/2012 16:37:25 ET T: 02/21/2012 16:58:42 ET JOB#: [id number]  cc: Domenico Achord P. Juliene Pina, MD, <Dictator> Margaretann Loveless, MD Janyth Contes Miki Blank MD ELECTRONICALLY SIGNED 02/21/2012 17:35

## 2014-07-01 NOTE — Consult Note (Signed)
    Comments   I spoke with pt's husband. Discussed transfer to the Hospice Home with him and he is in agreement with this. We also discussed code status and he agrees with DNR status. Order entered. Ginny Ward, RN, [facility], notified and will see pt in AM.  Dx: dementia   Secondary dx: FTT  Electronic Signatures: Braylynn Ghan, Harriett SineNancy (MD)  (Signed 12-Dec-13 20:04)  Authored: Palliative Care   Last Updated: 12-Dec-13 20:04 by Arbor Leer, Harriett SineNancy (MD)

## 2014-07-01 NOTE — Discharge Summary (Signed)
PATIENT NAME:  Catherine Nguyen, Catherine Nguyen#:  [id number] DATE OF BIRTH:  1940-08-07  DATE OF ADMISSION:  02/23/2012 DATE OF DISCHARGE:  02/24/2012  ADMISSION DIAGNOSIS:  Not ambulating. Not participating in assisted living. Failure to thrive.   FINAL DIAGNOSES:  1. Urinary tract infection.  2. Escherichia coli.  3. Dehydration.  4. 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  5. Alzheimer's dementia.  6. Acute renal failure.   DISPOSITION: Hospice.   HOSPITAL COURSE: [name] is a 74 year old female who has a history of severe Alzheimer's dementia who was in an assisted living facility up until coming to the hospital. She was brought in with complaints of not ambulating, not participating, mostly failure to thrive and decreased mental status. The patient was seen in the ER where she was diagnosed with a urinary tract infection. The patient had a CT of the pelvis that showed a mass, which is likely cancerous. The patient was admitted for treatment of her urinary tract infection with Rocephin. Her creatinine was elevated at the beginning due to intravascular volume depletion and dehydration, and by now her kidney function has improved. She had a urinary tract infection, which in the past has grown Escherichia coli. This time it grew out Enterococcus faecalis, sensitive to ampicillin, nitrofurantoin, and linezolid.  The patient actually has been made Hospice care for which she is going to be discharged without any medical treatment for this urinary tract infection.  The patient is transferred to Hospice today.   TIME SPENT: I spent about 20 minutes with this discharge.  ____________________________ Felipa Furnaceoberto Sanchez Gutierrez, MD rsg:bjt D: 02/24/2012 14:17:51 ET            T: 02/24/2012 14:23:33 ET         JOB#: [id number] Marylu LundOBERTO SANCHEZ GUTIERRE MD ELECTRONICALLY SIGNED 02/26/2012 14:13

## 2014-07-01 NOTE — H&P (Signed)
PATIENT NAME:  Catherine Nguyen, MONTECALVO MR#:  [id number] DATE OF BIRTH:  June 03, 1940  DATE OF ADMISSION:  10/02/2011  PRIMARY CARE PHYSICIAN: Dr. Yves Dill  ED REFERRING PHYSICIAN: Dr. Brien Mates   CHIEF COMPLAINT: Confusion, agitation.   HISTORY OF PRESENT ILLNESS: Patient is a 74 year old Caucasian female with history of Alzheimer's dementia, has hyperlipidemia, hypothyroidism, history of rheumatoid arthritis who currently resides at [facility] facility. Patient was brought in due to worsening confusion for the past three days. Patient had multiple falls. Daughter was initially concerned about a urinary tract infection but there is no evidence of urinary tract infection. Due to patient's progressive confusion and agitation she is not felt to be a candidate to return back to the assisted living. Currently daughter is not at the bedside, she has left. Patient is confused and is not able to provide me any useful history.   PAST MEDICAL HISTORY:  1. History of chronic myalgias. 2. Hyperlipidemia. 3. Hypothyroidism. 4. Alzheimer's dementia. 5. History of rheumatoid arthritis. 6. Hypertension. 7. Osteoarthritis. 8. Status post hysterectomy. 9. Cholecystectomy.  10. Endometrial biopsy. 11. History of uterine prolapse status post manual replacement.   ALLERGIES: None.   CURRENT MEDICATIONS:  1. Amitriptyline 10 mg at bedtime.  2. Aspirin 81 mg 1 tab p.o. daily.  3. Cerovite 1 tab p.o. daily.  4. Citalopram 20 daily.  5. Hydrochlorothiazide/triamterene 25/37.5, 1 tab p.o. daily.  6. Levothyroxine 88 mcg daily.  7. Lorazepam 0.5, 1 tab p.o. b.i.d.  8. Lunesta 1 tab p.o. at bedtime.  9. Meloxicam 7.5, 1 tab p.o. b.i.d.  10. Os-Cal plus vitamin D 1 tab t.i.d.  11. Pravastatin 40 mg at bedtime.  12. Premarin vaginal to apply on Mondays.  13. Tramadol 50, 2 tabs 2 times per day. 14. Vitamin D 50,000 international units once weekly on Thursday only.   SOCIAL HISTORY: History of smoking in  the past, none now. No alcohol. No drugs. Currently resides in assisted living.   FAMILY HISTORY: Both parents deceased. No family history of coronary artery disease or MI from previous records.   REVIEW OF SYSTEMS: Unobtainable due to patient's confusion.   PHYSICAL EXAMINATION:  VITAL SIGNS: Temperature 98.7, pulse 71, respirations 20, blood pressure 135/66, O2 96%.    GENERAL: Patient is a disheveled Caucasian female, currently not in any acute distress.   HEENT: Head atraumatic, normocephalic. Pupils equally round, reactive to light and accommodation. There is no conjunctival pallor. No scleral icterus. Nasal exam shows no drainage or ulceration. Oropharynx is clear without any exudate.   NECK: No thyromegaly. No carotid bruits.   CARDIOVASCULAR: Regular rate and rhythm. No murmurs, rubs, clicks, or gallops. PMI is not displaced.   LUNGS: Clear to auscultation bilaterally without any rales, rhonchi, wheezing.   ABDOMEN: Soft, nontender, nondistended. Positive bowel sounds x4.   EXTREMITIES: No clubbing, cyanosis, edema.   SKIN: No rash.   VASCULAR: Good DP, PT pulses.   PSYCHIATRIC: A little anxious.   NEUROLOGICAL: Cranial nerves II through XII grossly intact. Spontaneously moving all extremities. Not oriented to place or time, oriented to person only.   LABORATORY, DIAGNOSTIC, AND RADIOLOGICAL DATA: Glucose 87, BUN 19, creatinine 0.99, sodium 138, potassium 3.4, chloride 101, CO2 32, calcium 9.8, magnesium 1.3. LFTs are normal. CPK 117, CK-MB 5.1. Troponin less than 0.02. WBC 5.8, hemoglobin 12.2, platelet count 170. Urinalysis nitrites negative, leukocytes negative. CT scan of the head showed atrophy. No other abnormality. PA and lateral chest x-ray shows hyperinflation consistent with chronic obstructive  pulmonary disease, no other abnormality.   ASSESSMENT AND PLAN: Patient is a 74 year old white female with progressive dementia brought to ED with worsening confusion,  agitation for the past few days, also has multiple falls.  1. Worsening confusion. There is no evidence of any infection. She does have some minor electrolyte abnormalities. Her worsening confusion possibly could be related to worsening dementia. Patient will likely need higher level of care from assisted living to maybe skilled nursing facility. Will get PT, OT, case manager evaluation.  2. Depression/anxiety. Will continue amitriptyline, citalopram, increase the frequency of lorazepam as needed for anxiety.  3. Hypertension. Continue HCTZ/triamterene.  4. Hypokalemia, hypomagnesemia. Will replaced her electrolytes.  5. Hypothyroidism. Will continue Synthroid. Will check a TSH.  6. Hyperlipidemia. Continue simvastatin.  7. Miscellaneous. Will place her on Lovenox for deep vein thrombosis prophylaxis.   TIME SPENT: 35 minutes.  ____________________________ Lacie ScottsShreyang H. Allena KatzPatel, MD shp:cms D: 10/02/2011 21:47:21 ET T: 10/03/2011 07:01:53 ET  JOB#: [id number] cc: Alp Goldwater H. Allena KatzPatel, MD, <Dictator> Margaretann LovelessNeelam S. Khan, MD Charise CarwinSHREYANG H Mailee Klaas MD ELECTRONICALLY SIGNED 10/03/2011 19:57
